# Patient Record
Sex: Male | Born: 1950 | Race: Black or African American | Hispanic: No | Marital: Married | Smoking: Never smoker
Health system: Southern US, Community
[De-identification: ages and names within clinical notes are randomized; demographics above are authoritative.]

## PROBLEM LIST (undated history)

## (undated) DIAGNOSIS — N189 Chronic kidney disease, unspecified: Secondary | ICD-10-CM

## (undated) DIAGNOSIS — I10 Essential (primary) hypertension: Secondary | ICD-10-CM

## (undated) DIAGNOSIS — E785 Hyperlipidemia, unspecified: Secondary | ICD-10-CM

## (undated) DIAGNOSIS — R7303 Prediabetes: Secondary | ICD-10-CM

## (undated) DIAGNOSIS — K219 Gastro-esophageal reflux disease without esophagitis: Secondary | ICD-10-CM

## (undated) HISTORY — DX: Prediabetes: R73.03

## (undated) HISTORY — DX: Chronic kidney disease, unspecified: N18.9

## (undated) HISTORY — DX: Hyperlipidemia, unspecified: E78.5

## (undated) HISTORY — PX: RETINAL DETACHMENT SURGERY: SHX105

## (undated) HISTORY — DX: Gastro-esophageal reflux disease without esophagitis: K21.9

## (undated) HISTORY — DX: Essential (primary) hypertension: I10

## (undated) HISTORY — PX: KNEE ARTHROSCOPY: SUR90

---

## 2020-11-30 ENCOUNTER — Other Ambulatory Visit: Payer: Self-pay

## 2020-11-30 ENCOUNTER — Ambulatory Visit (INDEPENDENT_AMBULATORY_CARE_PROVIDER_SITE_OTHER): Payer: Medicare Other | Admitting: Urology

## 2020-11-30 ENCOUNTER — Encounter: Payer: Self-pay | Admitting: Urology

## 2020-11-30 VITALS — BP 120/74 | HR 76 | Ht 64.0 in | Wt 172.0 lb

## 2020-11-30 DIAGNOSIS — R972 Elevated prostate specific antigen [PSA]: Secondary | ICD-10-CM | POA: Diagnosis not present

## 2020-11-30 DIAGNOSIS — N400 Enlarged prostate without lower urinary tract symptoms: Secondary | ICD-10-CM

## 2020-11-30 NOTE — Progress Notes (Signed)
   11/30/2020 3:47 PM   Benjamin Lucas [birth year] [id number]  Referring provider: Lauro Regulus, MD [address] [facility] [place],  [place] [address]  Chief Complaint  Patient presents with   Elevated PSA    HPI: Benjamin Lucas is a 70 y.o. male referred for evaluation of an elevated PSA.  He presents today with his wife.  PSA drawn [facility] 11/22/2020 elevated 5.86 Prior history of an elevated PSA in 2020 with prior biopsy Lucas the [facility] go back to 2021.  There is mention of an elevated PSA but no level Lucas the time of his biopsy He did have PSA levels March 2020 3.46 and September 2021 2.79 No bothersome LUTS Denies dysuria, gross hematuria No history recurrent UTI No family history prostate cancer   PMH: Past Medical History:  Diagnosis Date   Chronic kidney disease    GERD (gastroesophageal reflux disease)    Hyperlipidemia    Hypertension    Prediabetes     Surgical History: Past Surgical History:  Procedure Laterality Date   KNEE ARTHROSCOPY Right    RETINAL DETACHMENT SURGERY      Home Medications:  Allergies as of 11/30/2020   No Known Allergies      Medication List        Accurate as of November 30, 2020 11:59 PM. If you have any questions, ask your nurse or doctor.          amLODipine 10 MG tablet Commonly known as: NORVASC Take by mouth.   aspirin 81 MG EC tablet Take by mouth.   atorvastatin 80 MG tablet Commonly known as: LIPITOR Take by mouth.   Cholecalciferol 25 MCG (1000 UT) tablet Take by mouth.   hydrochlorothiazide 25 MG tablet Commonly known as: HYDRODIURIL Take by mouth.   losartan 100 MG tablet Commonly known as: COZAAR Take by mouth.   tadalafil 20 MG tablet Commonly known as: CIALIS Take by mouth.        Allergies: No Known Allergies  Family History: No family history on file.  Social History:  has no history on file for  tobacco use, alcohol use, and drug use.   Physical Exam: BP 120/74   Pulse 76   Ht 5\' 4"  (1.626 m)   Wt 172 lb (78 kg)   BMI 29.52 kg/m   Constitutional:  Alert and oriented, No acute distress. HEENT: Benjamin Lucas, moist mucus membranes.  Trachea midline, no masses. Cardiovascular: No clubbing, cyanosis, or edema. Respiratory: Normal respiratory effort, no increased work of breathing. GI: Abdomen is soft, nontender, nondistended, no abdominal masses GU: Prostate 40 g, smooth without nodules Lymph: No cervical or inguinal lymphadenopathy. Skin: No rashes, bruises or suspicious lesions. Neurologic: Grossly intact, no focal deficits, moving all 4 extremities. Psychiatric: Normal mood and affect.   Assessment & Plan:    1.  Elevated PSA Prior biopsy June 2020 though PSA level Lucas the time of biopsy not known His wife states she thinks she has records Lucas home Will request biopsy records from July 2020 If the current PSA is higher than the PSA Lucas time of biopsy we will schedule prostate MRI    [place], MD  [facility] [address] [place], [place] [phone]

## 2020-12-02 ENCOUNTER — Encounter: Payer: Self-pay | Admitting: Urology

## 2021-01-26 ENCOUNTER — Telehealth: Payer: Self-pay | Admitting: Urology

## 2021-02-02 ENCOUNTER — Other Ambulatory Visit: Payer: Self-pay | Admitting: Internal Medicine

## 2021-02-02 ENCOUNTER — Other Ambulatory Visit (HOSPITAL_BASED_OUTPATIENT_CLINIC_OR_DEPARTMENT_OTHER): Payer: Self-pay | Admitting: Internal Medicine

## 2021-02-02 DIAGNOSIS — R17 Unspecified jaundice: Secondary | ICD-10-CM

## 2021-02-16 ENCOUNTER — Other Ambulatory Visit: Payer: Self-pay

## 2021-02-16 ENCOUNTER — Ambulatory Visit
Admission: RE | Admit: 2021-02-16 | Discharge: 2021-02-16 | Disposition: A | Discharge status: Home / Self Care | Payer: Medicare Other | Source: Ambulatory Visit | Attending: Internal Medicine | Admitting: Internal Medicine

## 2021-02-16 DIAGNOSIS — R17 Unspecified jaundice: Secondary | ICD-10-CM | POA: Diagnosis present

## 2021-03-20 ENCOUNTER — Telehealth: Payer: Self-pay | Admitting: Urology

## 2021-03-20 NOTE — Telephone Encounter (Signed)
Please schedule follow-up visit with PSA prior in January 2023

## 2021-05-21 ENCOUNTER — Other Ambulatory Visit: Payer: Self-pay

## 2021-05-21 DIAGNOSIS — R972 Elevated prostate specific antigen [PSA]: Secondary | ICD-10-CM

## 2021-05-22 ENCOUNTER — Other Ambulatory Visit: Payer: Self-pay

## 2021-05-22 ENCOUNTER — Other Ambulatory Visit: Payer: Medicare Other

## 2021-05-22 DIAGNOSIS — R972 Elevated prostate specific antigen [PSA]: Secondary | ICD-10-CM

## 2021-05-23 LAB — PSA: Prostate Specific Ag, Serum: 2.9 ng/mL (ref 0.0–4.0)

## 2021-05-25 ENCOUNTER — Encounter: Payer: Self-pay | Admitting: Urology

## 2021-05-25 ENCOUNTER — Other Ambulatory Visit: Payer: Self-pay

## 2021-05-25 ENCOUNTER — Ambulatory Visit (INDEPENDENT_AMBULATORY_CARE_PROVIDER_SITE_OTHER): Payer: Medicare Other | Admitting: Urology

## 2021-05-25 VITALS — BP 133/57 | HR 58 | Ht 64.0 in | Wt 161.0 lb

## 2021-05-25 DIAGNOSIS — Z87898 Personal history of other specified conditions: Secondary | ICD-10-CM | POA: Diagnosis not present

## 2021-05-25 DIAGNOSIS — N400 Enlarged prostate without lower urinary tract symptoms: Secondary | ICD-10-CM | POA: Diagnosis not present

## 2021-05-25 DIAGNOSIS — N5201 Erectile dysfunction due to arterial insufficiency: Secondary | ICD-10-CM

## 2021-05-25 MED ORDER — TADALAFIL 20 MG PO TABS: ORAL_TABLET | ORAL | 0 refills | Status: DC

## 2021-05-25 NOTE — Progress Notes (Signed)
° °  05/25/2021 10:51 AM   Benjamin Lucas Oct 09, 1950 [id number]  Referring provider: Kirk Ruths, MD [place] [facility] [place],  [place] [address]  Chief Complaint  Patient presents with   Elevated PSA    HPI: 71 y.o. male presents for 6 month follow-up.  See prior note 11/30/2020 Biopsy was performed 10/15/2018 for abnormal PSA velocity and prostate asymmetry with pathology showing benign prostate tissue/inflammation Biopsy done for abnormal PSA velocity of 3.46 Repeat PSA 05/22/2021 was 2.9 No bothersome LUTS  He did want to discuss ED management also today Difficulty achieving and maintaining an erection.  Has tried sildenafil through the [place] which he states was not effective. Dr. Ouida Sills prescribed tadalafil 20 mg however they stated the cost at his pharmacy was > $200   PMH: Past Medical History:  Diagnosis Date   Chronic kidney disease    GERD (gastroesophageal reflux disease)    Hyperlipidemia    Hypertension    Prediabetes     Surgical History: Past Surgical History:  Procedure Laterality Date   KNEE ARTHROSCOPY Right    RETINAL DETACHMENT SURGERY      Home Medications:  Allergies as of 05/25/2021   No Known Allergies      Medication List        Accurate as of May 25, 2021 10:51 AM. If you have any questions, ask your nurse or doctor.          STOP taking these medications    amLODipine 10 MG tablet Commonly known as: NORVASC Stopped by: Abbie Sons, MD       TAKE these medications    aspirin 81 MG EC tablet Take by mouth.   atorvastatin 80 MG tablet Commonly known as: LIPITOR Take by mouth.   Cholecalciferol 25 MCG (1000 UT) tablet Take by mouth.   hydrochlorothiazide 25 MG tablet Commonly known as: HYDRODIURIL Take by mouth.   lisinopril 20 MG tablet Commonly known as: ZESTRIL Take by mouth.   losartan 100 MG tablet Commonly known as: COZAAR Take by mouth.   tadalafil 20 MG  tablet Commonly known as: CIALIS Take by mouth.        Allergies: No Known Allergies  Family History: History reviewed. No pertinent family history.  Social History:  reports that he has never smoked. He has never used smokeless tobacco. No history on file for alcohol use and drug use.   Physical Exam: BP (!) 133/57    Pulse (!) 58    Ht 5\' 4"  (1.626 m)    Wt 161 lb (73 kg)    BMI 27.64 kg/m   Constitutional:  Alert and oriented, No acute distress. HEENT: [place] AT, moist mucus membranes.  Trachea midline, no masses. Cardiovascular: No clubbing, cyanosis, or edema. Respiratory: Normal respiratory effort, no increased work of breathing. Psychiatric: Normal mood and affect.   Assessment & Plan:    1.  Elevated PSA Previous benign biopsy for abnormal PSA velocity 3.46 Repeat PSA this month back to baseline at 2.9 Follow-up 1 year with PSA prior  2.  Erectile dysfunction Rx tadalafil 20 mg sent to [facility].  We discussed good Rx pricing at [facility] for this medication is < $1 per tab   Abbie Sons, MD  [place] [address] [place], [place] [address] [phone]

## 2022-03-21 ENCOUNTER — Other Ambulatory Visit: Payer: Self-pay | Admitting: Internal Medicine

## 2022-03-21 DIAGNOSIS — R634 Abnormal weight loss: Secondary | ICD-10-CM

## 2022-03-21 DIAGNOSIS — R112 Nausea with vomiting, unspecified: Secondary | ICD-10-CM

## 2022-03-28 ENCOUNTER — Ambulatory Visit: Payer: Medicare Other

## 2022-04-03 ENCOUNTER — Ambulatory Visit
Admission: RE | Admit: 2022-04-03 | Discharge: 2022-04-03 | Disposition: A | Discharge status: Home / Self Care | Payer: Medicare Other | Source: Ambulatory Visit | Attending: Internal Medicine | Admitting: Internal Medicine

## 2022-04-03 DIAGNOSIS — R112 Nausea with vomiting, unspecified: Secondary | ICD-10-CM | POA: Insufficient documentation

## 2022-04-03 DIAGNOSIS — R634 Abnormal weight loss: Secondary | ICD-10-CM | POA: Insufficient documentation

## 2022-04-03 MED ORDER — IOHEXOL 300 MG/ML  SOLN
100.0000 mL | Freq: Once | INTRAMUSCULAR | Status: AC | PRN
  Administered 2022-04-03: 100 mL via INTRAVENOUS

## 2022-05-24 ENCOUNTER — Other Ambulatory Visit: Payer: Self-pay

## 2022-05-24 DIAGNOSIS — R972 Elevated prostate specific antigen [PSA]: Secondary | ICD-10-CM

## 2022-05-27 ENCOUNTER — Encounter: Payer: Self-pay | Admitting: Urology

## 2022-05-27 ENCOUNTER — Other Ambulatory Visit: Payer: Medicare Other

## 2022-05-29 ENCOUNTER — Encounter: Payer: Self-pay | Admitting: Urology

## 2022-05-29 ENCOUNTER — Ambulatory Visit (INDEPENDENT_AMBULATORY_CARE_PROVIDER_SITE_OTHER): Payer: Medicare Other | Admitting: Urology

## 2022-05-29 VITALS — BP 126/71 | HR 66 | Ht 64.0 in | Wt 160.4 lb

## 2022-05-29 DIAGNOSIS — R972 Elevated prostate specific antigen [PSA]: Secondary | ICD-10-CM | POA: Diagnosis not present

## 2022-05-29 DIAGNOSIS — N5201 Erectile dysfunction due to arterial insufficiency: Secondary | ICD-10-CM

## 2022-05-29 MED ORDER — SILDENAFIL CITRATE 100 MG PO TABS: 100.0000 mg | ORAL_TABLET | Freq: Every day | ORAL | 0 refills | Status: DC | PRN

## 2022-05-29 NOTE — Progress Notes (Signed)
   05/29/2022 2:04 PM   Benjamin Lucas 15-Jul-1950 [id number]  Referring provider: Kirk Ruths, MD [place] [facility] [place],  [place] [address]  Chief Complaint  Patient presents with   Elevated PSA   Urologic history: 1.  Abnormal PSA velocity Prior evaluation [facility] Biopsy June 2020 for abnormal PSA velocity and prostate asymmetry; PSA 3.46; pathology benign/inflammatory  2.  Erectile dysfunction   HPI: 72 y.o. male presents for annual follow-up.  See prior note 11/30/2020 Biopsy was performed 10/15/2018 for abnormal PSA velocity and prostate asymmetry with pathology showing benign prostate tissue/inflammation Biopsy done for abnormal PSA velocity of 3.46 Repeat PSA 05/22/2021 was 2.9 No bothersome LUTS He did try tadalafil and noted tumescence but not rigidity   PMH: Past Medical History:  Diagnosis Date   Chronic kidney disease    GERD (gastroesophageal reflux disease)    Hyperlipidemia    Hypertension    Prediabetes     Surgical History: Past Surgical History:  Procedure Laterality Date   KNEE ARTHROSCOPY Right    RETINAL DETACHMENT SURGERY      Home Medications:  Allergies as of 05/29/2022   No Known Allergies      Medication List        Accurate as of May 29, 2022  2:04 PM. If you have any questions, ask your nurse or doctor.          aspirin EC 81 MG tablet Take by mouth.   atorvastatin 80 MG tablet Commonly known as: LIPITOR Take by mouth.   Cholecalciferol 25 MCG (1000 UT) tablet Take by mouth.   hydrochlorothiazide 25 MG tablet Commonly known as: HYDRODIURIL Take by mouth.   lisinopril 20 MG tablet Commonly known as: ZESTRIL Take by mouth.   losartan 100 MG tablet Commonly known as: COZAAR Take by mouth.   sildenafil 100 MG tablet Commonly known as: VIAGRA Take 1 tablet (100 mg total) by mouth daily as needed for erectile dysfunction. Started by: Abbie Sons, MD    tadalafil 20 MG tablet Commonly known as: CIALIS Takke 1Tab 1 hour prior to intercourse        Allergies: No Known Allergies  Family History: History reviewed. No pertinent family history.  Social History:  reports that he has never smoked. He has never used smokeless tobacco. No history on file for alcohol use and drug use.   Physical Exam: BP 126/71   Pulse 66   Ht 5\' 4"  (1.626 m)   Wt 160 lb 6.4 oz (72.8 kg)   BMI 27.53 kg/m   Constitutional:  Alert and oriented, No acute distress. HEENT: [place] AT, moist mucus membranes.  Trachea midline, no masses. Cardiovascular: No clubbing, cyanosis, or edema. Respiratory: Normal respiratory effort, no increased work of breathing. Psychiatric: Normal mood and affect.   Assessment & Plan:    1.  Elevated PSA Previous benign biopsy for abnormal PSA velocity 3.46 PSA drawn today Follow-up 1 year with PSA prior  2.  Erectile dysfunction He wanted to try sildenafil and Rx was sent to pharmacy. Second line options including vacuum erection devices and intracavernosal injections were discussed   Abbie Sons, MD  [place] [address], [place], [place] [address] [phone]

## 2022-05-30 ENCOUNTER — Telehealth: Payer: Self-pay | Admitting: *Deleted

## 2022-05-30 LAB — PSA: Prostate Specific Ag, Serum: 3.4 ng/mL (ref 0.0–4.0)

## 2022-05-30 NOTE — Telephone Encounter (Signed)
-----  Message from Abbie Sons, MD sent at 05/30/2022  4:09 PM EST ----- PSA stable at 3.4.  Follow-up 1 year with PSA

## 2022-05-30 NOTE — Telephone Encounter (Signed)
Notified patient as instructed, patient pleased. Discussed follow-up appointments, patient agrees  

## 2022-07-28 IMAGING — US US ABDOMEN LIMITED
1 series · 14 of 25 positions shown · non-contrast
Comparison: None.

CLINICAL DATA: Elevated bilirubin

EXAM:
ULTRASOUND ABDOMEN LIMITED RIGHT UPPER QUADRANT

[Series 1: us abdomen limited ruq (liver/gb) · 14 of 61 slices shown]
[im 1/61]
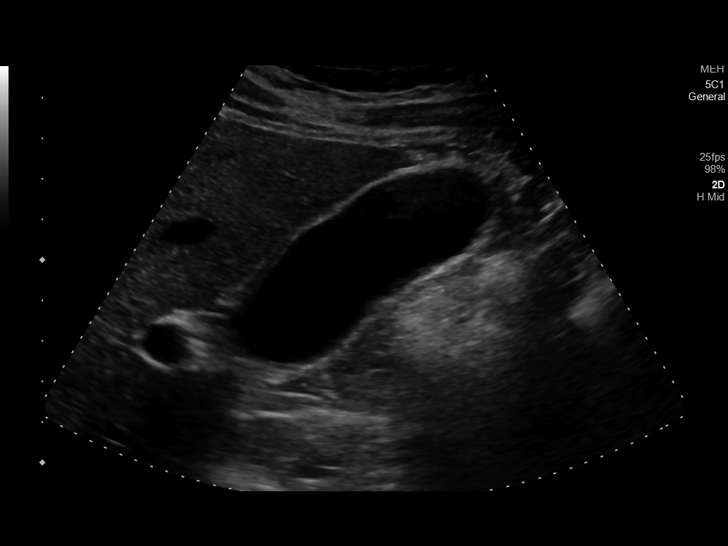
[im 6/61]
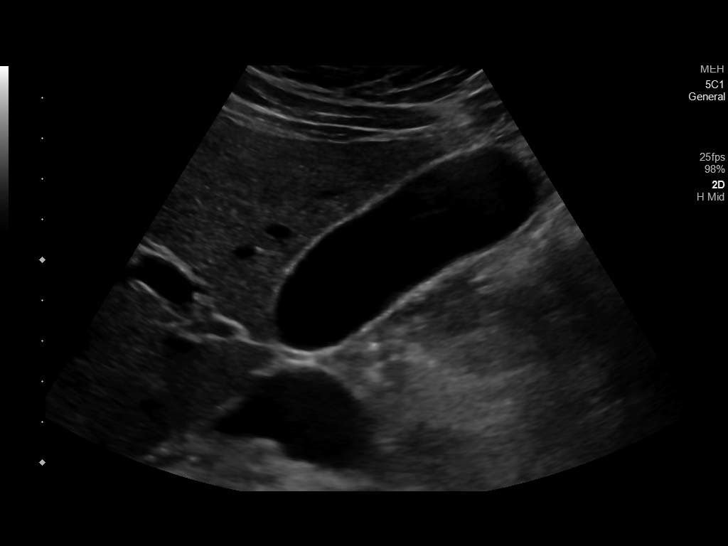
[im 11/61]
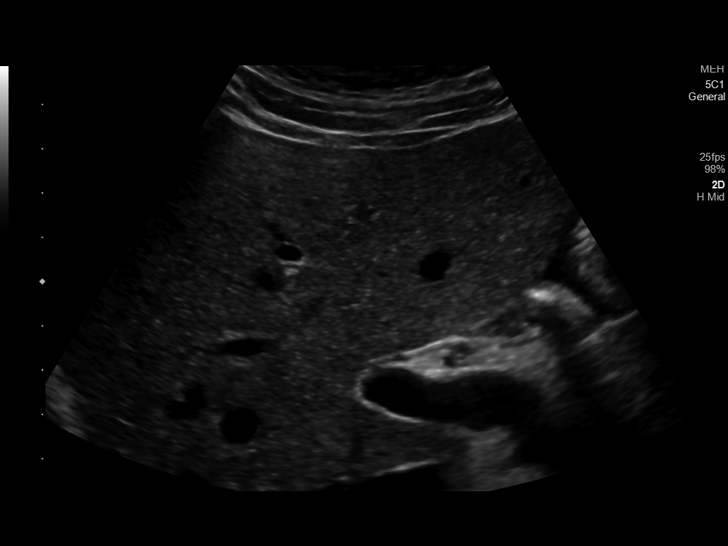
[im 16/61]
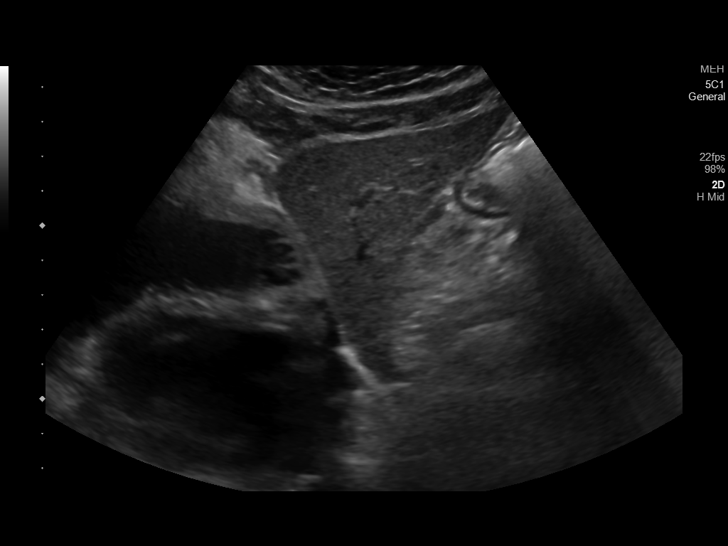
[im 21/61]
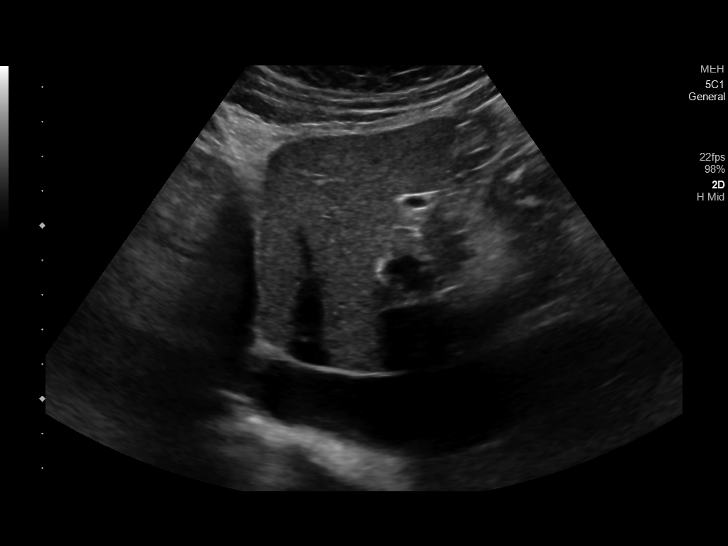
[im 23/61]
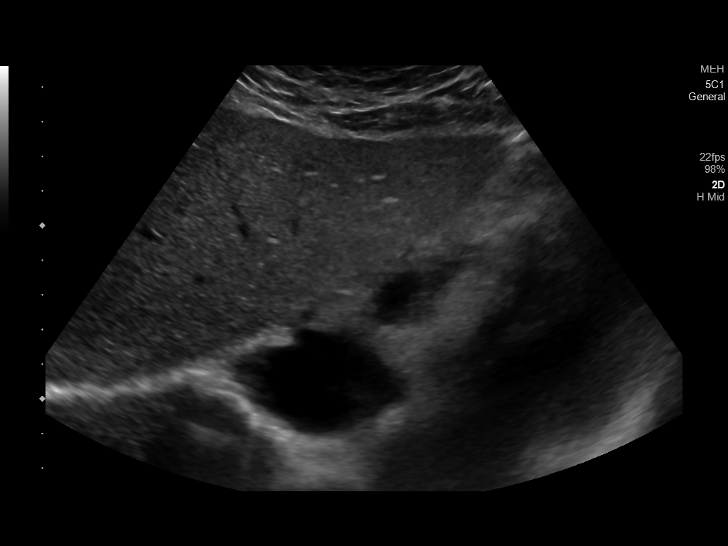
[im 28/61]
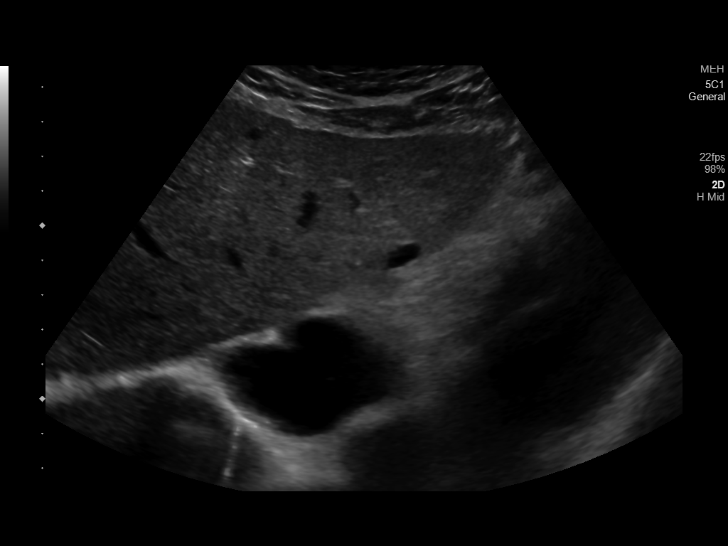
[im 33/61]
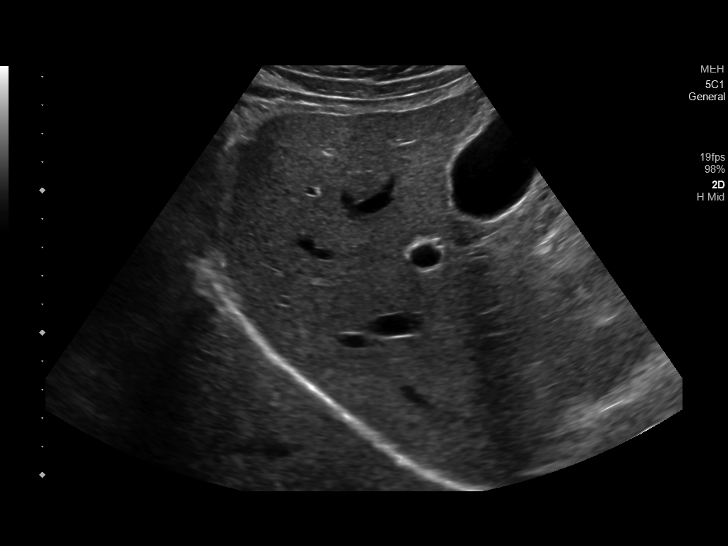
[im 38/61]
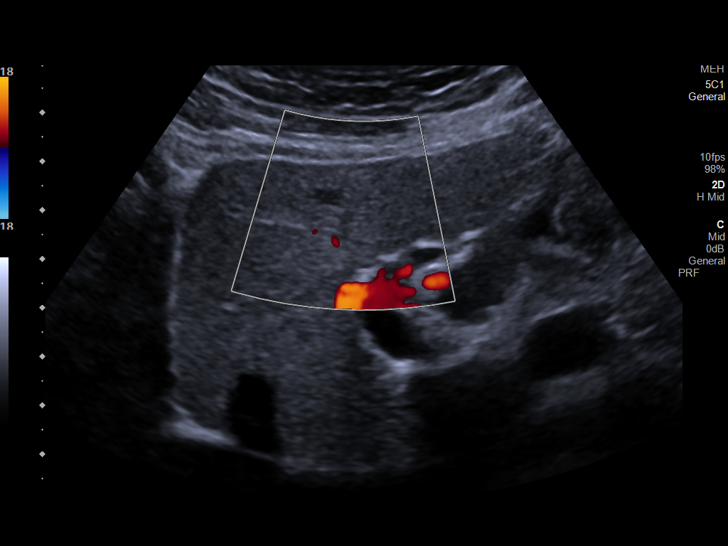
[im 41/61]
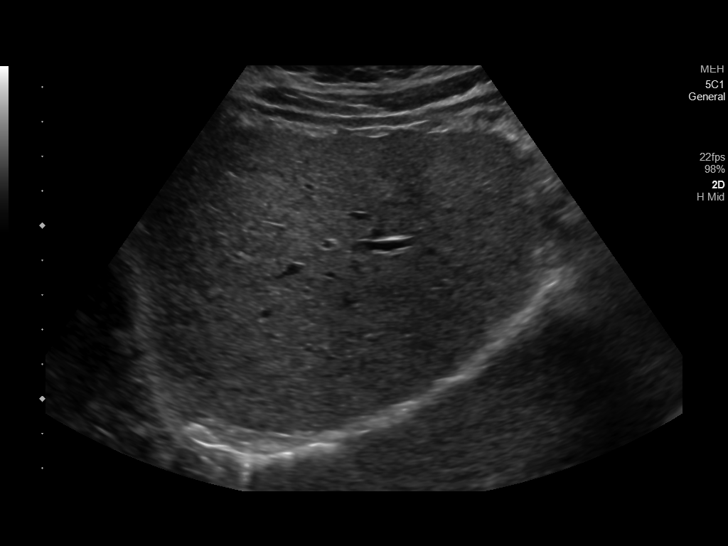
[im 46/61]
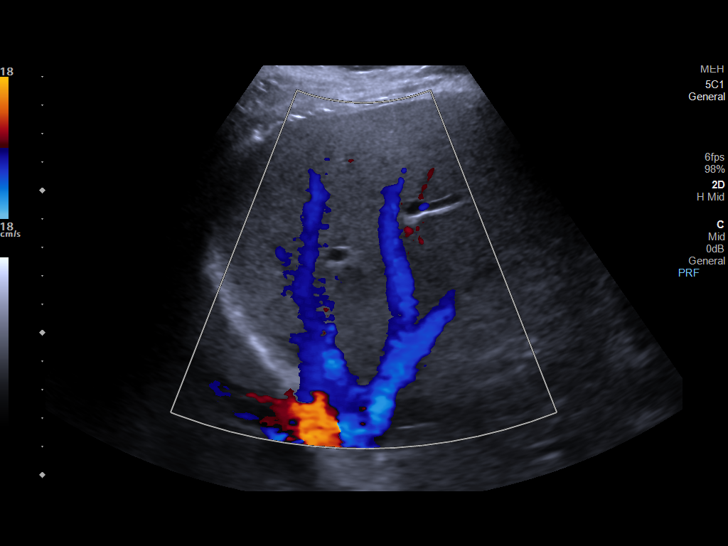
[im 51/61]
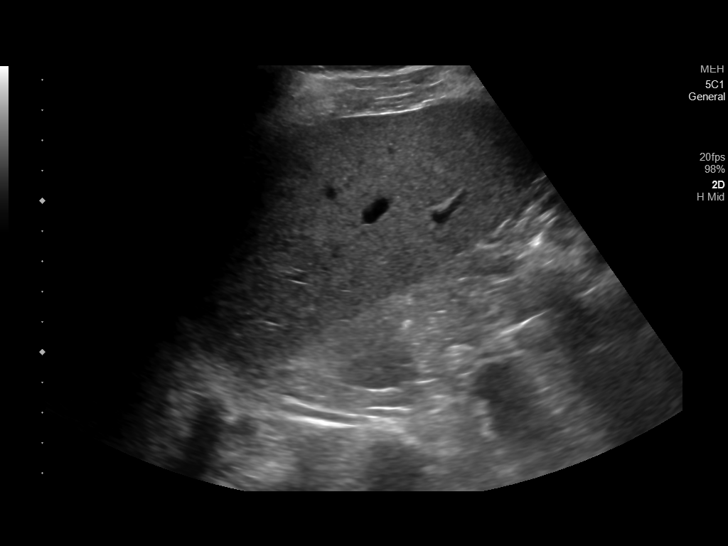
[im 56/61]
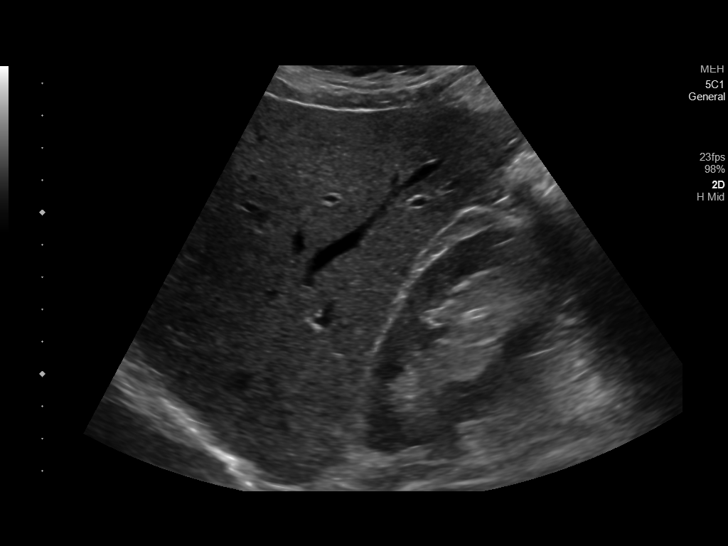
[im 61/61]
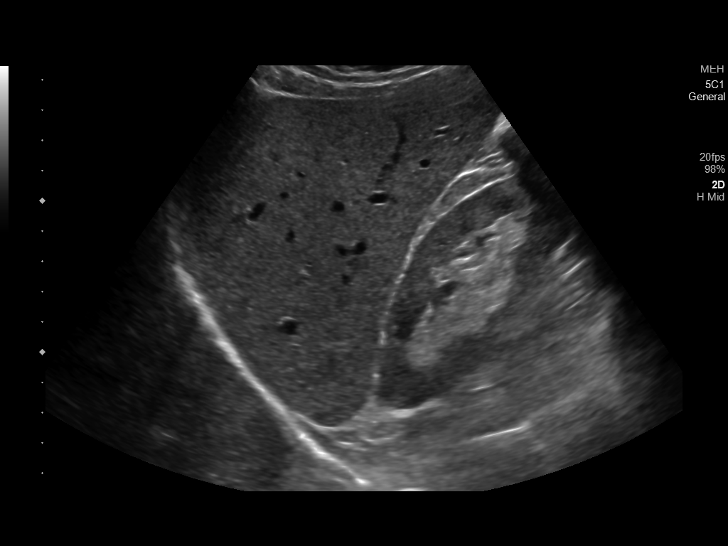

[14 of 25 positions shown; findings below may reference images not displayed]

FINDINGS: Gallbladder:

No gallstones or wall thickening visualized. No sonographic Murphy
sign noted by sonographer.

Common bile duct:

Diameter: 3.4 mm

Liver:

Echogenicity within normal limits. Small cyst in the left hepatic
lobe measuring 11 mm. Portal vein is patent on color Doppler imaging
with normal direction of blood flow towards the liver.

Other: None.
IMPRESSION: 1. No biliary dilatation by sonography
2. Small simple appearing cysts in the left hepatic lobe

## 2022-08-30 ENCOUNTER — Ambulatory Visit: Payer: Medicare Other

## 2022-08-30 DIAGNOSIS — R634 Abnormal weight loss: Secondary | ICD-10-CM | POA: Diagnosis not present

## 2022-08-30 DIAGNOSIS — K219 Gastro-esophageal reflux disease without esophagitis: Secondary | ICD-10-CM | POA: Diagnosis not present

## 2023-05-26 ENCOUNTER — Other Ambulatory Visit: Payer: Self-pay | Admitting: *Deleted

## 2023-05-26 ENCOUNTER — Other Ambulatory Visit: Payer: Medicare Other

## 2023-05-26 DIAGNOSIS — R972 Elevated prostate specific antigen [PSA]: Secondary | ICD-10-CM

## 2023-05-27 LAB — PSA: Prostate Specific Ag, Serum: 3.3 ng/mL (ref 0.0–4.0)

## 2023-05-30 ENCOUNTER — Ambulatory Visit (INDEPENDENT_AMBULATORY_CARE_PROVIDER_SITE_OTHER): Payer: Medicare Other | Admitting: Urology

## 2023-05-30 ENCOUNTER — Encounter: Payer: Self-pay | Admitting: Urology

## 2023-05-30 VITALS — BP 137/76 | HR 59 | Ht 64.0 in | Wt 151.3 lb

## 2023-05-30 DIAGNOSIS — R972 Elevated prostate specific antigen [PSA]: Secondary | ICD-10-CM

## 2023-05-30 DIAGNOSIS — Z87898 Personal history of other specified conditions: Secondary | ICD-10-CM

## 2023-05-30 DIAGNOSIS — N5201 Erectile dysfunction due to arterial insufficiency: Secondary | ICD-10-CM | POA: Diagnosis not present

## 2023-05-30 DIAGNOSIS — N529 Male erectile dysfunction, unspecified: Secondary | ICD-10-CM

## 2023-05-30 MED ORDER — SILDENAFIL CITRATE 100 MG PO TABS: 100.0000 mg | ORAL_TABLET | Freq: Every day | ORAL | 3 refills | Status: DC | PRN

## 2023-05-30 MED ORDER — TADALAFIL 20 MG PO TABS: ORAL_TABLET | ORAL | 3 refills | Status: DC

## 2023-05-30 NOTE — Progress Notes (Signed)
I, Benjamin Lucas, acting as a scribe for Benjamin Altes, MD., have documented all relevant documentation on the behalf of Benjamin Altes, MD, as directed by Benjamin Altes, MD while in the presence of Benjamin Altes, MD.  05/30/2023 2:24 PM   [name] 09/26/1950 [id number]  Referring provider: Lauro Regulus, MD [address] [facility] [address] [place],  [place] [address]  Chief Complaint  Patient presents with   Elevated PSA   Urologic history: 1.  Abnormal PSA velocity Prior evaluation [facility] Biopsy June [facility] for abnormal PSA velocity and prostate asymmetry; PSA 3.46; pathology benign/inflammatory   2.  Erectile dysfunction  HPI: Benjamin Lucas is a 73 y.o. male presents for annaula follow up.  No problem since last year's visit. No bothersome LUTS Using tadalafil and sildenafil for ED, however does not take them together and alternates. PSA 05/26/23 stable at 3.3  PSA trend   Prostate Specific Ag, Serum  Latest Ref Rng 0.0 - 4.0 ng/mL  05/22/2021 2.9   05/29/2022 3.4   05/26/2023 3.3     PMH: Past Medical History:  Diagnosis Date   Chronic kidney disease    GERD (gastroesophageal reflux disease)    Hyperlipidemia    Hypertension    Prediabetes     Surgical History: Past Surgical History:  Procedure Laterality Date   KNEE ARTHROSCOPY Right    RETINAL DETACHMENT SURGERY      Home Medications:  Allergies as of 05/30/2023   No Known Allergies      Medication List        Accurate as of May 30, 2023  2:24 PM. If you have any questions, ask your nurse or doctor.          STOP taking these medications    losartan 100 MG tablet Commonly known as: COZAAR Stopped by: Benjamin Lucas       TAKE these medications    aspirin EC 81 MG tablet Take by mouth.   atorvastatin 80 MG tablet Commonly known as: LIPITOR Take by mouth.   Cholecalciferol 25 MCG (1000 UT) tablet Take by mouth.   fluticasone 50  MCG/ACT nasal spray Commonly known as: FLONASE Place 2 sprays into both nostrils daily.   hydrochlorothiazide 12.5 MG tablet Commonly known as: HYDRODIURIL Take 12.5 mg by mouth daily. What changed: Another medication with the same name was removed. Continue taking this medication, and follow the directions you see here. Changed by: Benjamin Lucas   lisinopril 20 MG tablet Commonly known as: ZESTRIL Take 20 mg by mouth daily. What changed: Another medication with the same name was removed. Continue taking this medication, and follow the directions you see here. Changed by: Benjamin Lucas   sildenafil 100 MG tablet Commonly known as: VIAGRA Take 1 tablet (100 mg total) by mouth daily as needed for erectile dysfunction.   tadalafil 20 MG tablet Commonly known as: CIALIS Takke 1Tab 1 hour prior to intercourse        Allergies: No Known Allergies  Social History:  reports that he has never smoked. He has never used smokeless tobacco. No history on file for alcohol use and drug use.   Physical Exam: BP 137/76 (BP Location: Left Arm, Patient Position: Sitting, Cuff Size: Normal)   Pulse (!) 59   Ht 5\' 4"  (1.626 m)   Wt 151 lb 4.8 oz (68.6 kg)   BMI 25.97 kg/m   Constitutional:  Alert  and oriented, No acute distress. HEENT:  AT, moist mucus membranes.  Trachea midline, no masses. Cardiovascular: No clubbing, cyanosis, or edema. Respiratory: Normal respiratory effort, no increased work of breathing. GI: Abdomen is soft, nontender, nondistended, no abdominal masses Skin: No rashes, bruises or suspicious lesions. Neurologic: Grossly intact, no focal deficits, moving all 4 extremities. Psychiatric: Normal mood and affect.   Assessment & Plan:    1. History elevated PSA Stable PSA Continued annual follow-up  2. Erectile dysfunction Sildenafil/tadalafil refill.  [facility] [address] [place], [place] [address] [phone]

## 2023-09-23 ENCOUNTER — Encounter: Payer: Self-pay | Admitting: Internal Medicine

## 2023-09-23 ENCOUNTER — Other Ambulatory Visit: Payer: Self-pay | Admitting: Internal Medicine

## 2023-09-23 DIAGNOSIS — R634 Abnormal weight loss: Secondary | ICD-10-CM

## 2023-09-23 DIAGNOSIS — R410 Disorientation, unspecified: Secondary | ICD-10-CM

## 2023-09-26 ENCOUNTER — Ambulatory Visit
Admission: RE | Admit: 2023-09-26 | Discharge: 2023-09-26 | Disposition: A | Discharge status: Home / Self Care | Source: Ambulatory Visit | Attending: Internal Medicine | Admitting: Internal Medicine

## 2023-09-26 DIAGNOSIS — R634 Abnormal weight loss: Secondary | ICD-10-CM | POA: Insufficient documentation

## 2023-09-26 DIAGNOSIS — R410 Disorientation, unspecified: Secondary | ICD-10-CM | POA: Diagnosis present

## 2023-09-26 MED ORDER — GADOBUTROL 1 MMOL/ML IV SOLN
7.0000 mL | Freq: Once | INTRAVENOUS | Status: AC | PRN
  Administered 2023-09-26: 7 mL via INTRAVENOUS

## 2024-05-28 ENCOUNTER — Other Ambulatory Visit: Payer: Self-pay

## 2024-05-28 DIAGNOSIS — Z87898 Personal history of other specified conditions: Secondary | ICD-10-CM

## 2024-05-29 LAB — PSA: Prostate Specific Ag, Serum: 3.9 ng/mL (ref 0.0–4.0)

## 2024-05-31 ENCOUNTER — Encounter: Payer: Self-pay | Admitting: Urology

## 2024-05-31 ENCOUNTER — Ambulatory Visit (INDEPENDENT_AMBULATORY_CARE_PROVIDER_SITE_OTHER): Payer: Self-pay | Admitting: Urology

## 2024-05-31 VITALS — BP 111/56 | HR 60 | Ht 64.0 in | Wt 156.8 lb

## 2024-05-31 DIAGNOSIS — Z87898 Personal history of other specified conditions: Secondary | ICD-10-CM | POA: Diagnosis not present

## 2024-05-31 DIAGNOSIS — N5201 Erectile dysfunction due to arterial insufficiency: Secondary | ICD-10-CM

## 2024-05-31 MED ORDER — TADALAFIL 20 MG PO TABS: ORAL_TABLET | ORAL | 3 refills | Status: AC

## 2024-05-31 MED ORDER — SILDENAFIL CITRATE 100 MG PO TABS: 100.0000 mg | ORAL_TABLET | Freq: Every day | ORAL | 3 refills | Status: AC | PRN

## 2024-05-31 NOTE — Progress Notes (Signed)
" ° °  05/31/2024 1:46 PM   Benjamin Lucas 03-24-1951 [id number]  Referring provider: Lenon Layman ORN, MD [address] [facility] [place],  [place] [address]  Chief Complaint  Patient presents with   Elevated PSA   Urologic history: 1.  Abnormal PSA velocity Prior evaluation [facility] Biopsy June 2020 for abnormal PSA velocity and prostate asymmetry; PSA 3.46; pathology benign/inflammatory   2.  Erectile dysfunction Tadalafil /sildenafil -alternating dosing  HPI: Benjamin Lucas is a 74 y.o. male who presents for annual follow-up  No changes since last year's visit Urologic ROS negative Remains on sildenafil /tadalafil  with good efficacy PSA 05/28/2024 was stable at 3.9  PSA trend    Prostate Specific Ag, Serum  Latest Ref Rng 0.0 - 4.0 ng/mL  05/22/2021 2.9   05/29/2022 3.4   05/26/2023 3.3   05/28/2024 3.9    PMH: Past Medical History:  Diagnosis Date   Chronic kidney disease    GERD (gastroesophageal reflux disease)    Hyperlipidemia    Hypertension    Prediabetes     Surgical History: Past Surgical History:  Procedure Laterality Date   KNEE ARTHROSCOPY Right    RETINAL DETACHMENT SURGERY      Home Medications:  Allergies as of 05/31/2024   No Known Allergies      Medication List        Accurate as of May 31, 2024  1:46 PM. If you have any questions, ask your nurse or doctor.          STOP taking these medications    hydrochlorothiazide 12.5 MG tablet Commonly known as: HYDRODIURIL Stopped by: Glendia Barba, MD       TAKE these medications    aspirin EC 81 MG tablet Take by mouth.   atorvastatin 80 MG tablet Commonly known as: LIPITOR Take by mouth.   Cholecalciferol 25 MCG (1000 UT) tablet Take by mouth.   fluticasone 50 MCG/ACT nasal spray Commonly known as: FLONASE Place 2 sprays into both nostrils daily.   lisinopril 20 MG tablet Commonly known as: ZESTRIL Take 20 mg by mouth daily.    sildenafil  100 MG tablet Commonly known as: VIAGRA  Take 1 tablet (100 mg total) by mouth daily as needed for erectile dysfunction.   tadalafil  20 MG tablet Commonly known as: CIALIS  Takke 1Tab 1 hour prior to intercourse        Allergies: Allergies[1]  Family History: No family history on file.  Social History:  reports that he has never smoked. He has never used smokeless tobacco. No history on file for alcohol use and drug use.   Physical Exam: BP (!) 111/56   Pulse 60   Ht 5' 4 (1.626 m)   Wt 156 lb 12.8 oz (71.1 kg)   BMI 26.91 kg/m   Constitutional:  Alert, No acute distress. HEENT: [place] AT Respiratory: Normal respiratory effort, no increased work of breathing. Psychiatric: Normal mood and affect.    Assessment & Plan:    1.  History elevated PSA PSA remains stable and in normal range Continue annual follow-up  2.  Erectile dysfunction PDE 5 inhibitors were refilled   Glendia JAYSON Barba, MD  [facility] [address] [place], [place] [address] [phone]    [1] No Known Allergies  "

## 2025-05-24 ENCOUNTER — Other Ambulatory Visit

## 2025-05-31 ENCOUNTER — Ambulatory Visit: Admitting: Urology
